# Patient Record
Sex: Female | Born: 1955
Health system: Southern US, Community
[De-identification: ages and names within clinical notes are randomized; demographics above are authoritative.]

---

## 2017-04-21 ENCOUNTER — Inpatient Hospital Stay
Payer: BC Managed Care – PPO | Attending: Internal Medicine | Admitting: Rehabilitative and Restorative Service Providers"

## 2017-04-21 DIAGNOSIS — R42 Dizziness and giddiness: Secondary | ICD-10-CM | POA: Insufficient documentation

## 2017-04-21 NOTE — Progress Notes (Signed)
Name: Brianna Smith Age: 61 y.o.   Referring Physician: Lestine Mount, MD   Date of Injury: 03/23/2017  Date Care Plan Established/Reviewed: 04/21/2017  Date Treatment Started: 04/21/2017  Visit Count: 1   Diagnosis:   1. Dizziness        Subjective     History of Present Illness   History of Present Illness: Pt reports that she has been having bouts of vertigo.  Saw PCP 3-4 months ago and he performed Eply maneuver and I vomited in the office. I am here to assess what I can do to prevent in the future. For about two weeks I have been feeling pretty good. Performing all of my normal everyday activities.   Pt with h/o osetopenia.   No vision changes, no hearing changes, no HA.   No extremity N/T.   Functional Limitations (PLOF): Currently no functional limitations.     Objective     Range of Motion   Cervical AROM WFL.    Tests   Cervical     Left (Cervical)   Negative alar ligament, Sharper-Purser and anterior shear.     Right (Cervical)   Negative alar ligament, Sharp-Purser and anterior shear. Vestibular     Negative: spontaneous nystagmus (fixation present), spontaneous nystagmus (fixation supressed), gaze-evoked nystagmus (fixation present) and vertebral artery    Smooth Pursuit: WNL    VOR: WNL H    Saccade: WNL H/V    Left Side   Negative: head thrust, Dix-Hallpike and roll test    Right Side:   Negative: head thrust, Dix-Hallpike and roll test    (-) SCAAT    Modified CTSIB all 30 seconds.   Stable surface eyes open  Stable surface eyes closed  Unstable surface eyes open  Unstable surface eyes closed.              Treatment     Therapeutic Exercises   Justification: To improve Balance, Flexibility/ROM, Stabilization and Strength     lengthy discussion with pt about eval findings, etiology of BPPV, treatment, concerns regarding self performance of treatment should BPPV reoccur.   (25' total)     Neuromuscular Re-Education   Justification: To improve facilitation, strength and endurance of involved  segments  NA    Manual Therapy   Justification: To improve mobility and reduce soft tissue and jt mechanical restrictions impeding movement and inhibiting active motion.    NA    Therapeutic Activity   Justification: To improve pt ability to perform functional activities related to daily activities and goals established throughout course of episode of care.  NA       ---      ---   Total Time   Timed Minutes  25 minutes   Untimed Minutes  25 minutes   Total Time  50 minutes        Assessment   Brianna Smith is a 61 y.o. female presenting with c/o prior vertigo who requires Physical Therapy for the following:  Impairments: hesitation with movement, transfers.     Pain located: NA    Clinical presentation: stable symptoms have resolved.   Barriers to therapy: none  Prior Level of Function: Currently no functional limitations.   Prognosis: excellent  Plan   Visits per week: 1  Number of Sessions: 2  [employer] One on One  16109: Therapeutic Exercise: To Develop Strength and Endurance, ROM and Flexibility  [id number]: Gait Training  60454: Neuromuscular Reeducation  97140: Manual Therapy techniques (mobilization, manipulation, manual traction)  60454: Therapeutic Activities: Dynamic activities to improve functional performance  Supervised Modalities  97010: Thermal modalities: hot/cold packs  Pt without s/s balance deficiency, (-) BPPV testing, and no current c/o or functional limitations. Pt does express anxiety about reoccurrence of symptoms. Not unlikely to need one additional session for habituation should she note dizziness in the next 1-2 weeks with resuming normal speed of ADLs, IADLs.        Goals    Goal 1:  Progress HEP to include habituation exercises should pt note reoccurance of vertigo.    Sessions:  2                                         Brianna Smith, PT   [facility] Brianna Smith, PT, DPT   [place] [phone]

## 2018-08-01 ENCOUNTER — Other Ambulatory Visit: Payer: Self-pay | Admitting: Internal Medicine

## 2020-11-18 IMAGING — US US ABDOMEN LIMITED RUQ/ASCITES
1 series · 14 of 25 positions shown · non-contrast
Comparison: None.

CLINICAL DATA: Elevated liver function tests.

EXAM:
ULTRASOUND ABDOMEN LIMITED RIGHT UPPER QUADRANT

[Series 1: us abdomen limited ruq/ascites · 0.22mm/px · 14 of 72 slices shown]
[im 1/72]
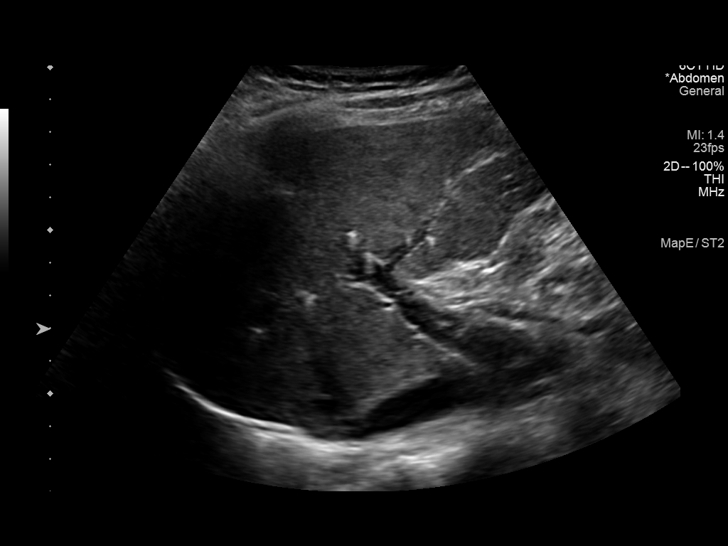
[im 6/72]
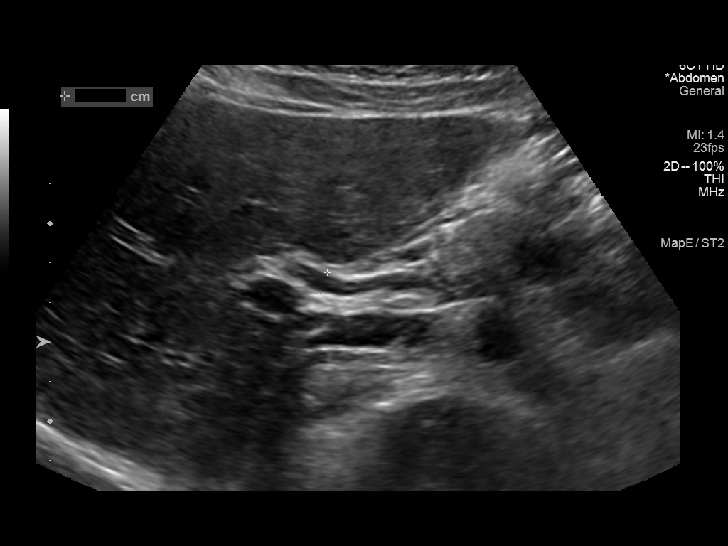
[im 12/72]
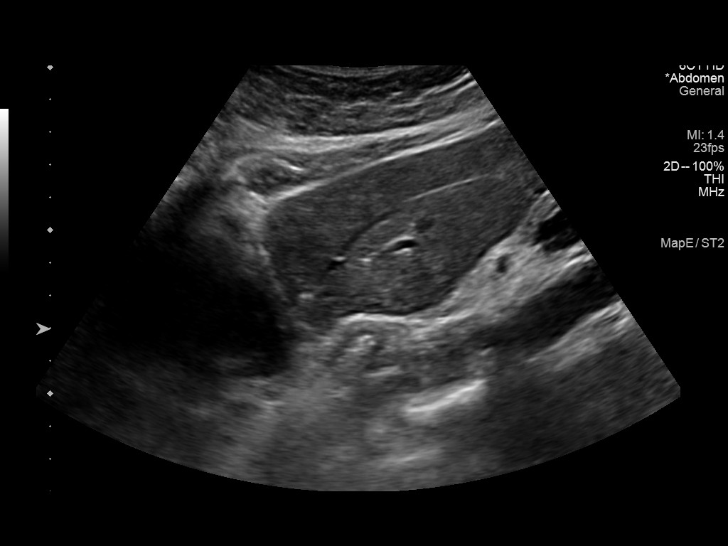
[im 18/72]
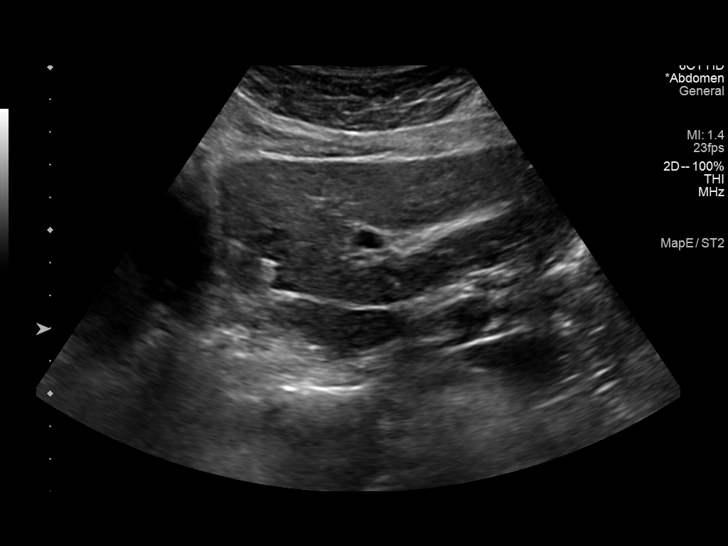
[im 24/72]
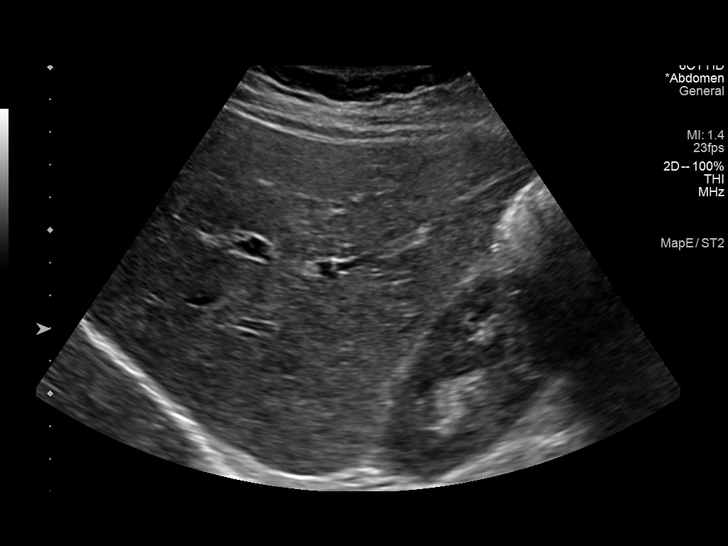
[im 27/72]
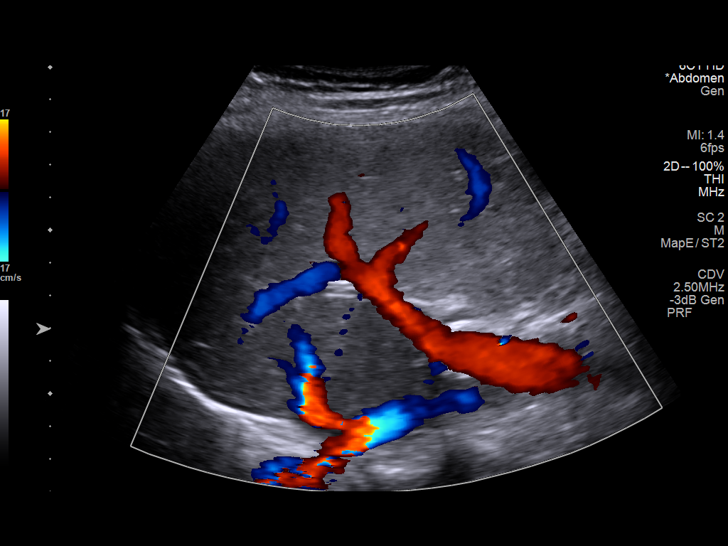
[im 33/72]
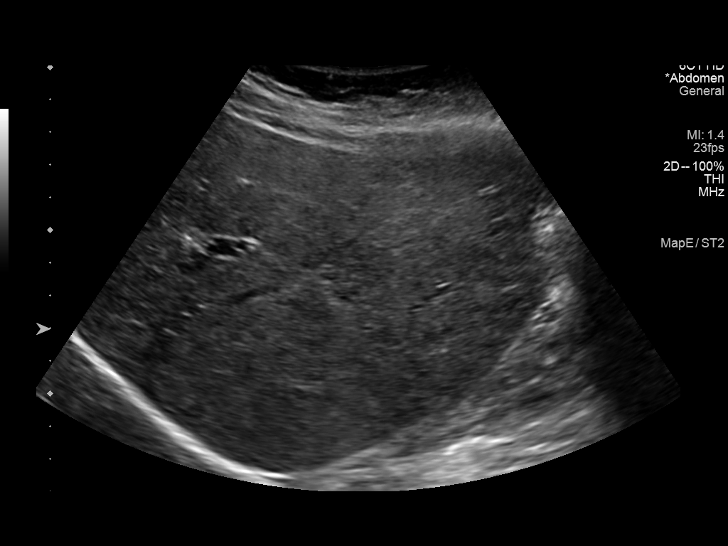
[im 39/72]
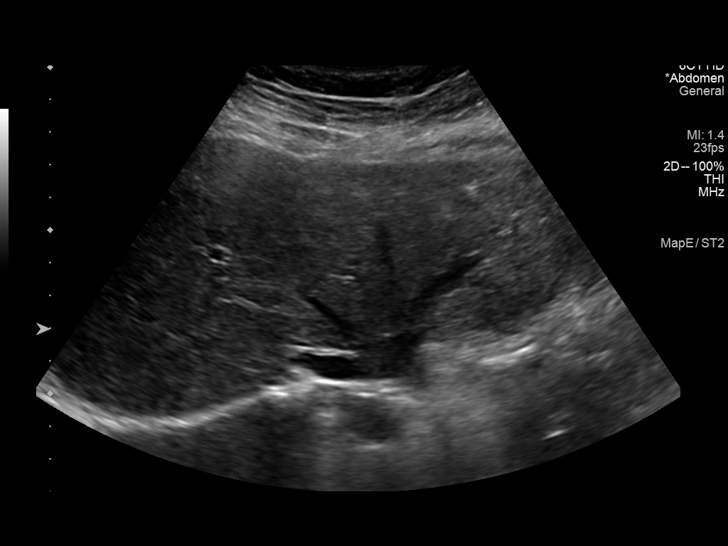
[im 45/72]
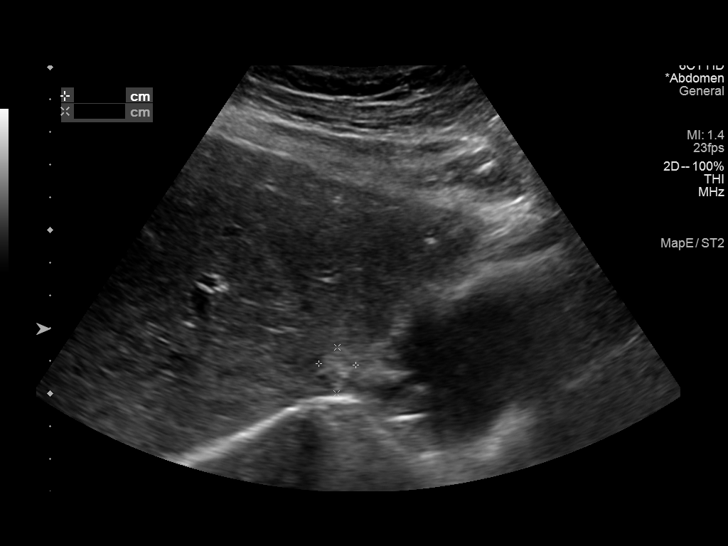
[im 48/72]
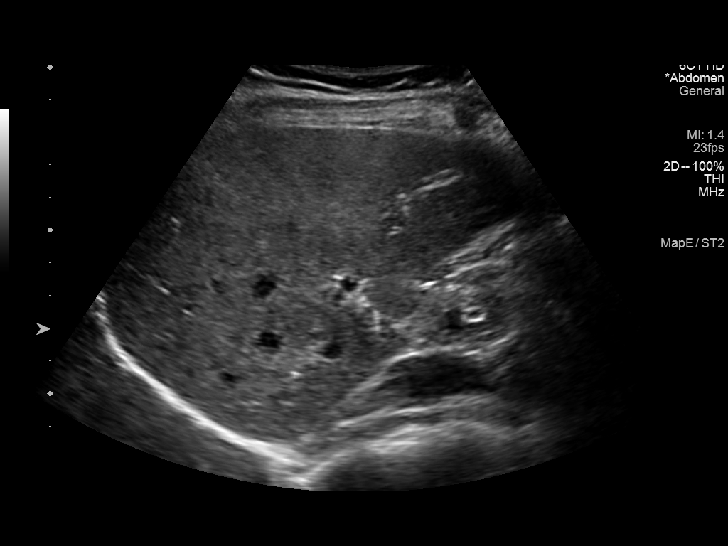
[im 54/72]
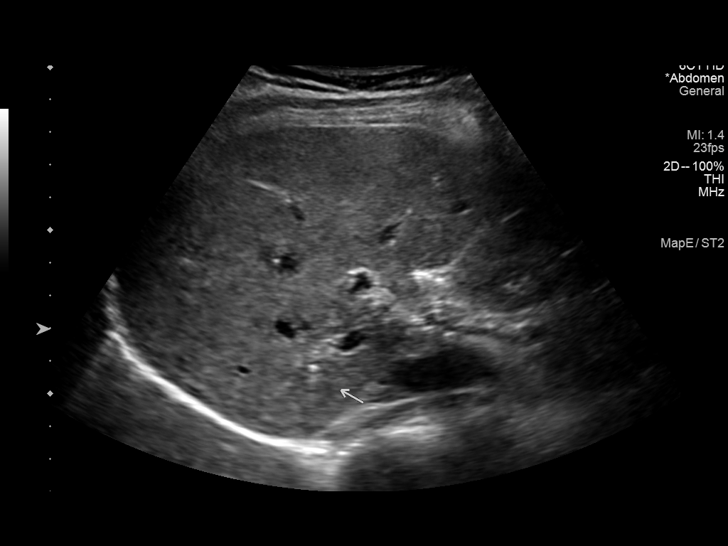
[im 60/72]
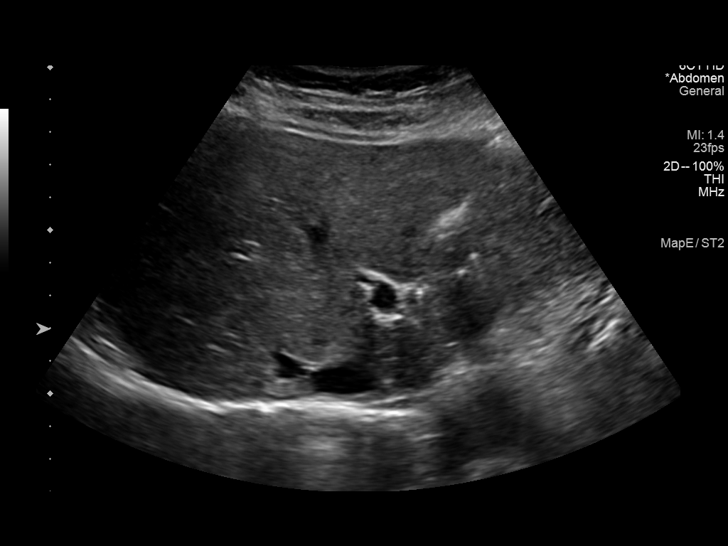
[im 66/72]
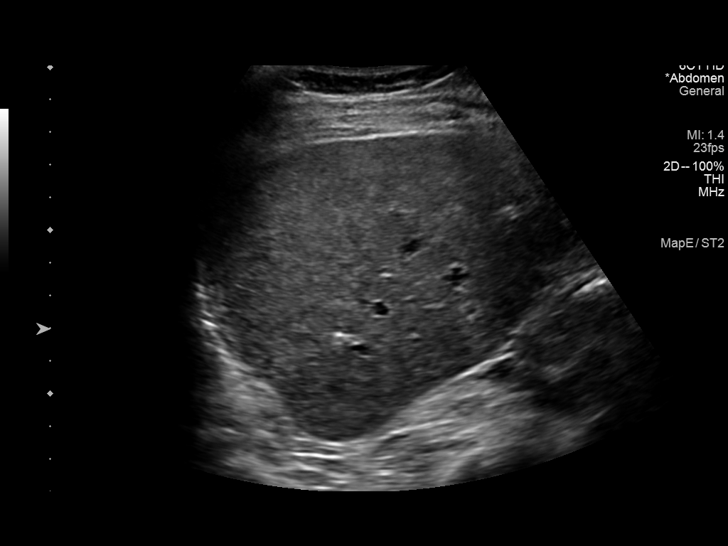
[im 72/72]
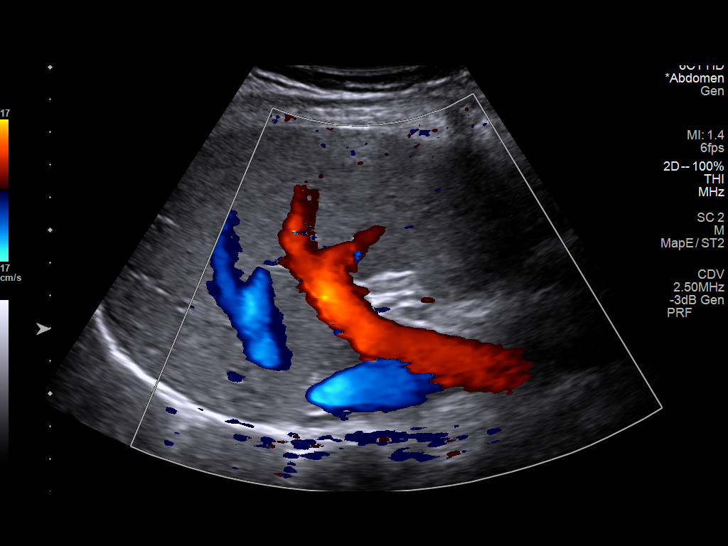

[14 of 25 positions shown; findings below may reference images not displayed]

FINDINGS: Gallbladder:

Status post cholecystectomy.

Common bile duct:

Diameter: 6 mm which is within normal limits.

Liver:

Several hyperechoic areas are noted which may represent hemangiomas,
but MRI is recommended to rule out other pathology. Within normal
limits in parenchymal echogenicity. Portal vein is patent on color
Doppler imaging with normal direction of blood flow towards the
liver.

Other: None.
IMPRESSION: Status post cholecystectomy.

Several hyperechoic areas are noted in the a patent parenchyma which
may represent hemangiomas, but other pathology cannot be excluded.
Further evaluation with MRI is recommended.

## 2020-12-07 IMAGING — MR MR ABDOMEN WO/W CM
12 of 17 series · 28 of 48 positions shown · IV contrast (16ml Multihance)
Comparison: None

CLINICAL DATA: Follow-up hepatic hemangioma suspected based on
ultrasound in a 64-year-old female

EXAM:
MRI ABDOMEN WITHOUT AND WITH CONTRAST
TECHNIQUE: Multiplanar multisequence MR imaging of the abdomen was performed
both before and after the administration of intravenous contrast.
CONTRAST:  16mL MULTIHANCE GADOBENATE DIMEGLUMINE 529 MG/ML IV SOLN

[Series 3: T2 · coronal · 5.0mm · 1.56mm/px · 1 of 27 slices shown (1 of 3)]
[im 1/27]
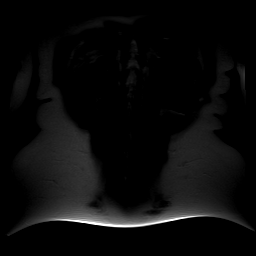

[Series 4: axial tru fisp · axial · 5.0mm · 1.41mm/px · 1 of 39 slices shown]
[im 1/39]
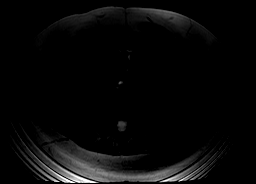

[Series 5: T2 · axial · 6.5mm · 0.68mm/px · 1 of 30 slices shown (2 of 3)]
[im 1/30]
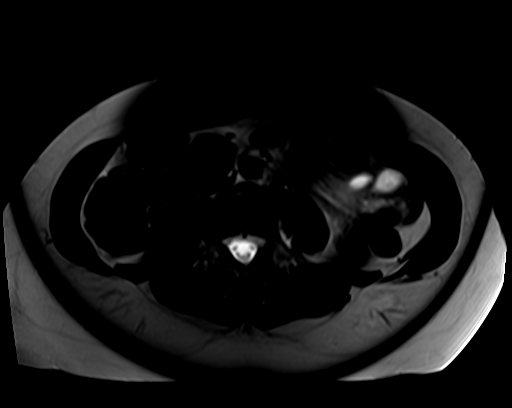

[Series 6: ep2d_diff_b50_500_800_p2 · axial · 6.0mm · 1.82mm/px · z∈[-82,+144]mm · 3 of 89 slices shown]
[im 1/89]
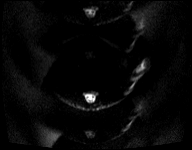
[im 45/89]
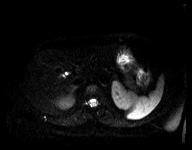
[im 89/89]
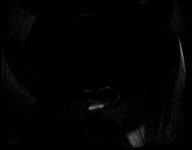

[Series 7: ep2d_diff_b50_500_800_p2_adc · axial · 6.0mm · 1.82mm/px · 1 of 29 slices shown]
[im 1/29]
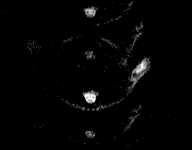

[Series 8: T2 · axial · 5.0mm · 1.37mm/px · 1 of 35 slices shown (3 of 3)]
[im 1/35]
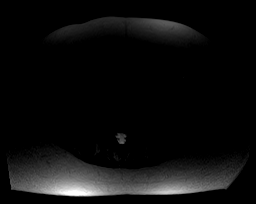

[Series 9: axial in out · axial · 6.0mm · 0.68mm/px · z∈[-118,+96]mm · 2 of 64 slices shown]
[im 1/64]
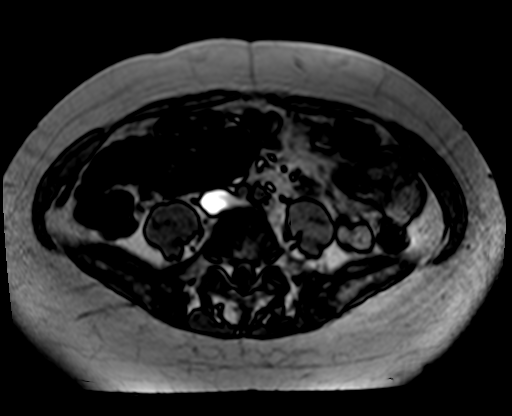
[im 64/64]
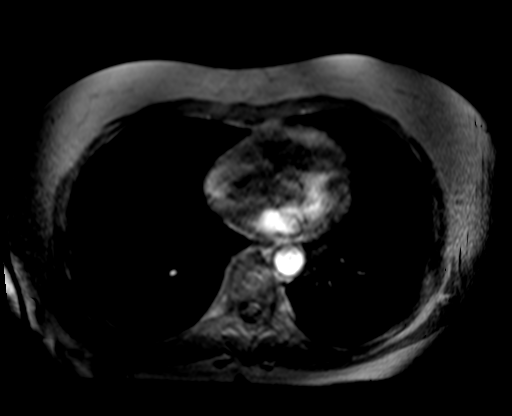

[Series 10: T1 dynamic · axial · non-contrast · 2.2mm · 0.68mm/px · z∈[-115,+94]mm · 4 of 96 slices shown]
[im 1/96]
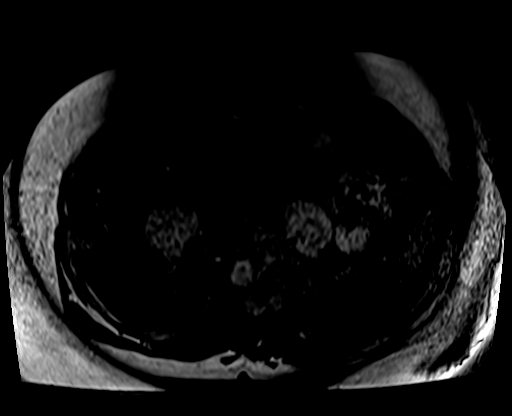
[im 32/96]
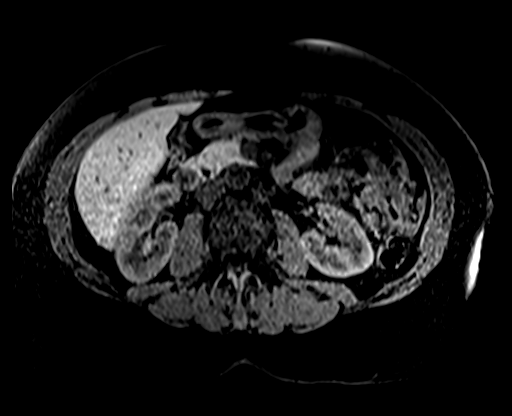
[im 64/96]
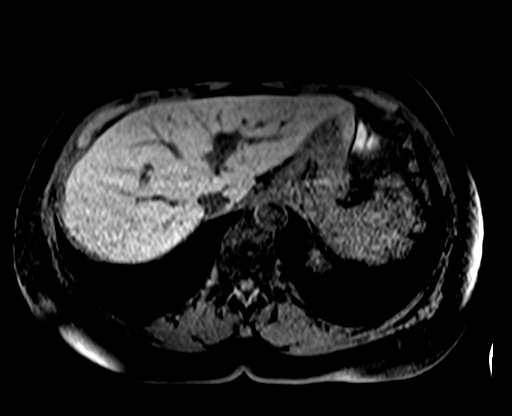
[im 96/96]
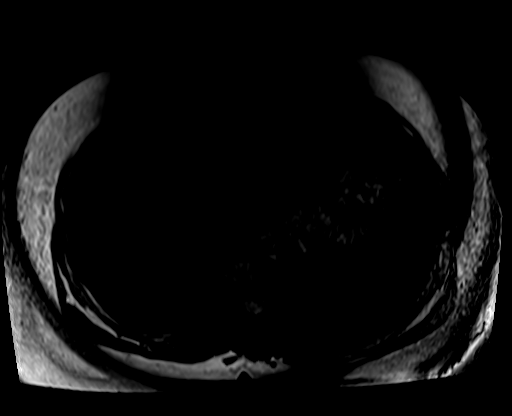

[Series 11: post 25 sec · axial · 2.2mm · 0.68mm/px · z∈[-115,+94]mm · 4 of 96 slices shown]
[im 1/96]
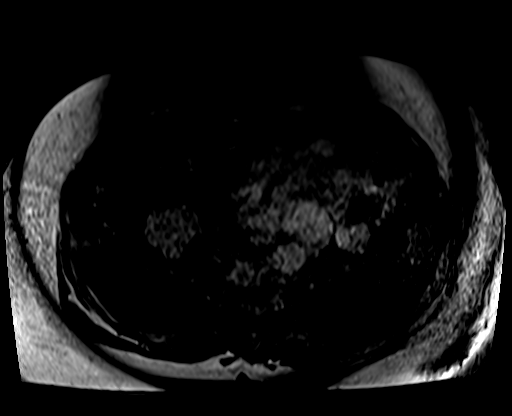
[im 32/96]
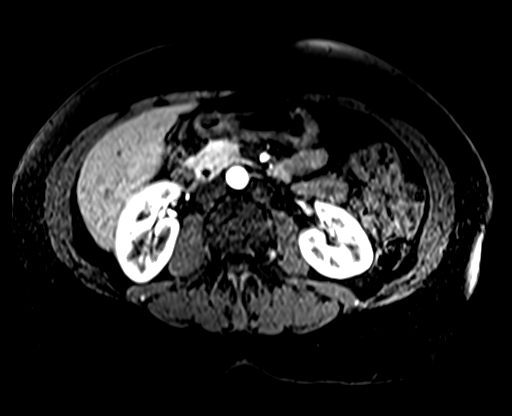
[im 64/96]
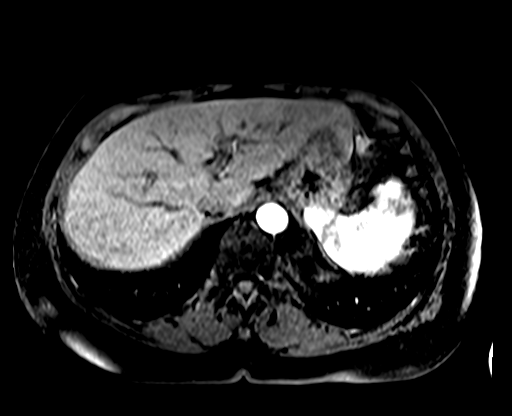
[im 96/96]
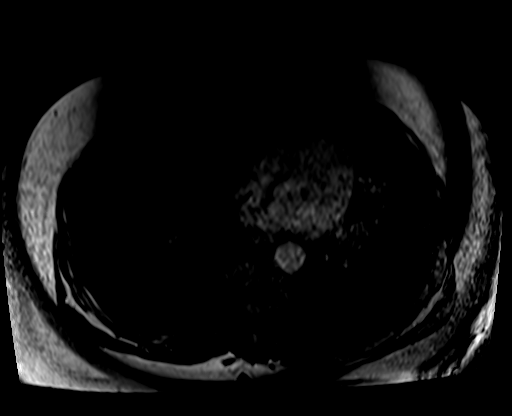

[Series 12: post 25 sec_sub · axial · 2.2mm · 0.68mm/px · z∈[-115,+94]mm · 4 of 96 slices shown]
[im 1/96]
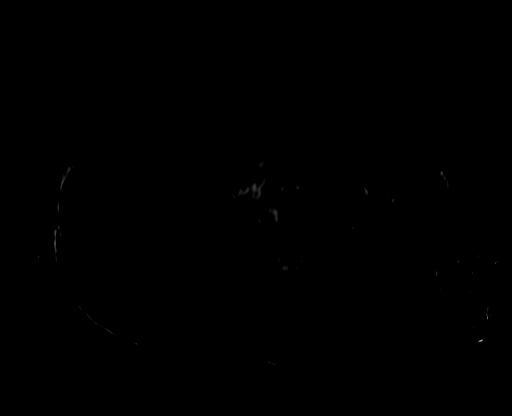
[im 32/96]
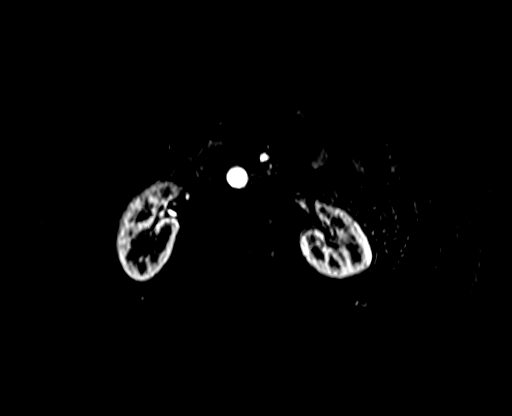
[im 64/96]
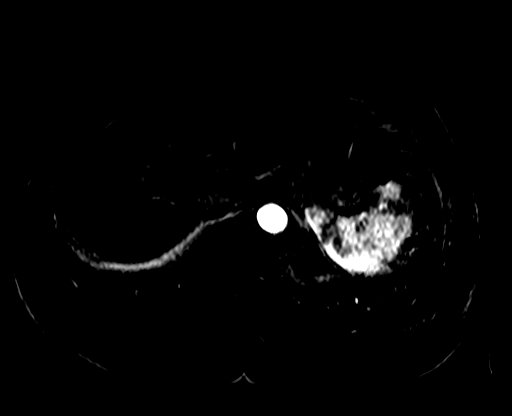
[im 96/96]
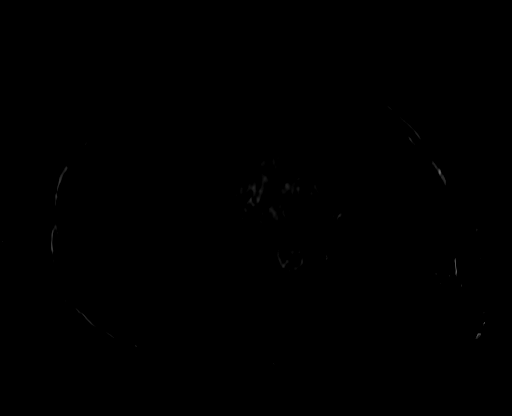

[Series 13: post 45 sec · axial · 2.2mm · 0.68mm/px · z∈[-115,+94]mm · 4 of 96 slices shown]
[im 1/96]
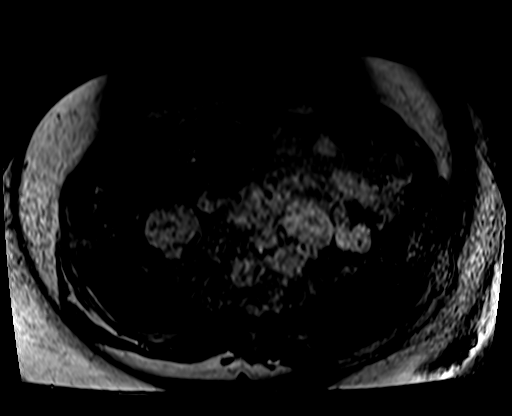
[im 32/96]
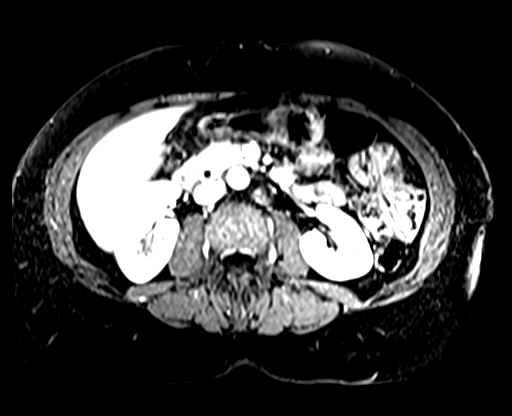
[im 64/96]
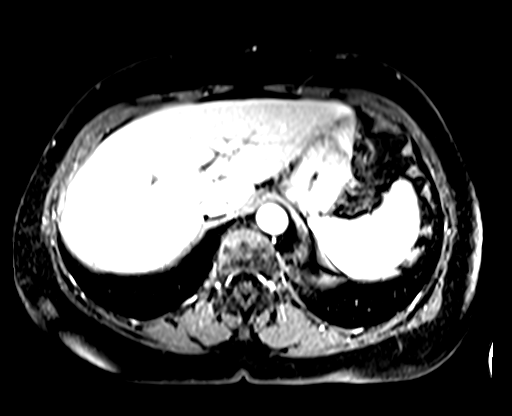
[im 96/96]
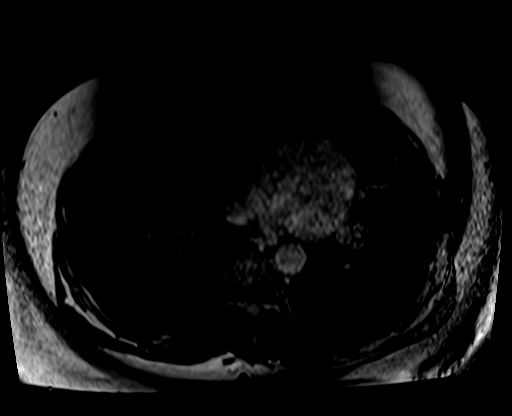

[Series 14: post 45 sec_sub · axial · 2.2mm · 0.68mm/px · z∈[-115,-47]mm · 2 of 96 slices shown]
[im 1/96]
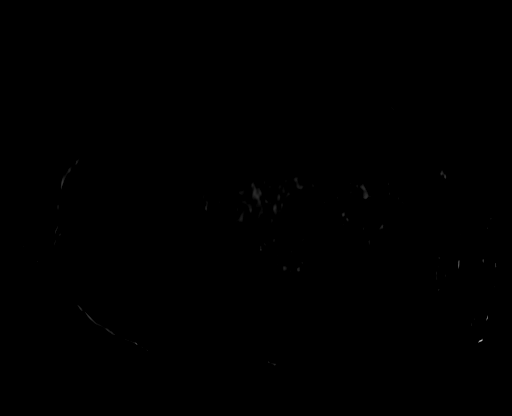
[im 32/96]
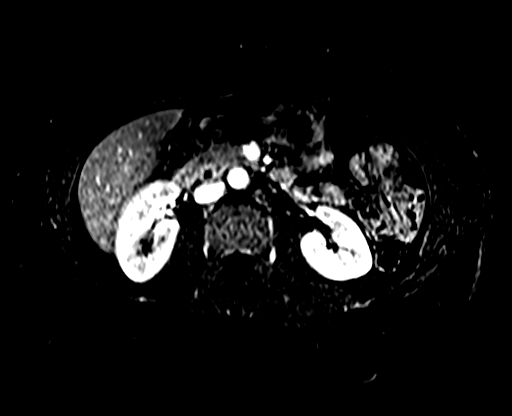

[28 of 48 positions shown; findings below may reference images not displayed]

FINDINGS: Lower chest: Incidental imaging of the lung bases is unremarkable on
MRI, limited assessment.

Hepatobiliary: Post cholecystectomy. No biliary duct dilation. No
significant fat or iron deposition in the liver. No focal,
suspicious hepatic lesion. Portal vein is patent. Hepatic veins are
patent.

Pancreas: Pancreas with normal intrinsic T1 signal. No ductal
dilation. No sign of inflammation.

Spleen: Spleen normal size and contour. The no focal, suspicious
splenic lesion.

Adrenals/Urinary Tract: Adrenal glands are normal. Symmetric renal
enhancement. No hydronephrosis or suspicious lesion.

Stomach/Bowel: Visualized portions within the abdomen are
unremarkable.

Vascular/Lymphatic: No pathologically enlarged lymph nodes
identified. No abdominal aortic aneurysm demonstrated.

Other: Small area of T2 hyperintensity along the LEFT
paraspinal/periaortic chain measuring approximately 1.4 x 1.3 cm on
image 27 of series 4. Area displays signal loss on out of phase as
compared to in phase T1 weighted gradient echo imaging and lacks
significant enhancement.

Musculoskeletal: No suspicious bone lesions identified.
IMPRESSION: 1. No suspicious hepatic lesion. Areas in the liver may have
represented artifact along the margin of the liver could be related
to areas of portal fat at the caudate and RIGHT and LEFT lobe
interface.
2. Post cholecystectomy. No biliary duct dilation.
3. Small area of T2 hyperintensity along the LEFT
paraspinal/periaortic chain measuring approximately 1.4 x 1.3 cm.
Areas of signal loss on out of phase T1 weighted imaging and lacks
significant enhancement. This is favored to represent a small
lymphangioma based on the presence of signal loss which raise the
question of triglyceride content. Lack of significant enhancement is
reassuring as well. Consider six-month follow-up to assess for
stability.

## 2022-01-29 IMAGING — MR MR ABDOMEN WO/W CM
10 of 19 series · 21 of 48 positions shown · IV contrast (15ml multihance)
Comparison: MRI abdomen mixed [DATE]
COMPARISON: MRI abdomen mixed [DATE]
COMPARISON: MRI abdomen mixed [DATE]

Addendum:
CLINICAL DATA: Follow-up abdominal mass seen on prior MRI.

EXAM:
MRI ABDOMEN WITHOUT AND WITH CONTRAST
TECHNIQUE: Multiplanar multisequence MR imaging of the abdomen was performed
both before and after the administration of intravenous contrast.
CONTRAST:  15mL MULTIHANCE GADOBENATE DIMEGLUMINE 529 MG/ML IV SOLN

[Series 3: cor haste · coronal · 5.0mm · 0.72mm/px · 1 of 29 slices shown]
[im 1/29]
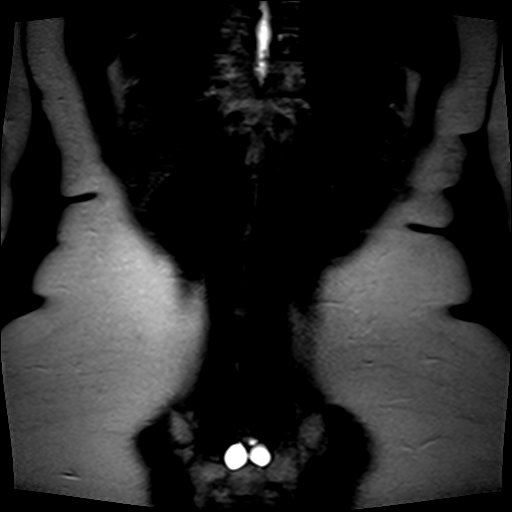

[Series 4: axial haste · axial · 6.0mm · 0.72mm/px · 1 of 37 slices shown]
[im 1/37]
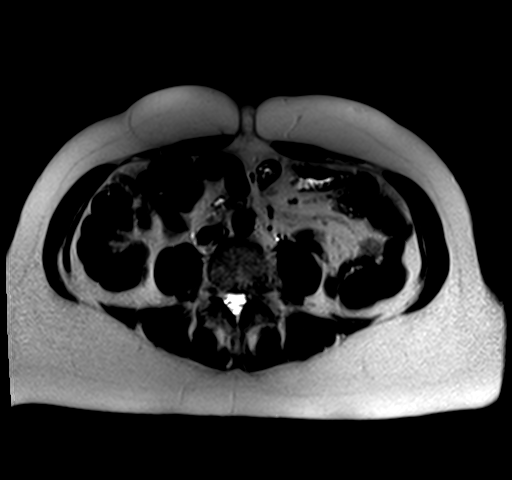

[Series 5: T1 · axial · 6.0mm · 0.72mm/px · z∈[-93,+144]mm · 3 of 74 slices shown]
[im 1/74]
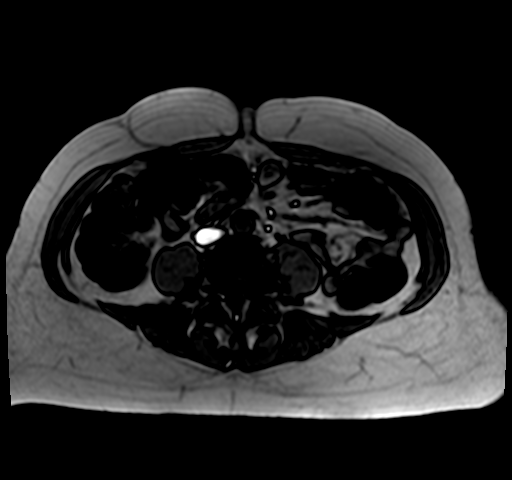
[im 37/74]
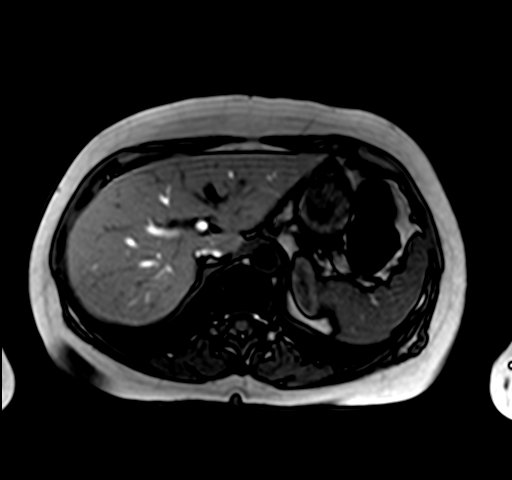
[im 74/74]
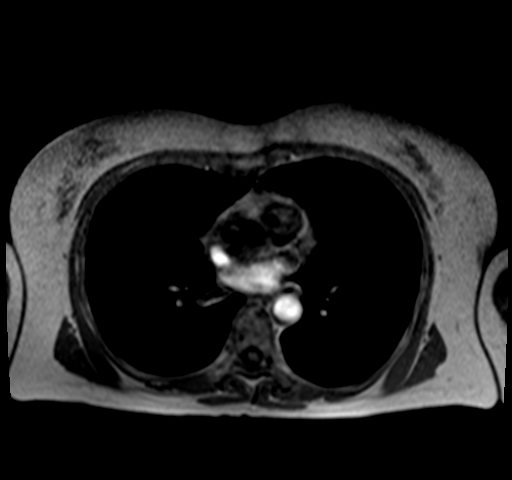

[Series 6: bSSFP · axial · 4.0mm · 0.72mm/px · z∈[-94,+146]mm · 2 of 61 slices shown]
[im 1/61]
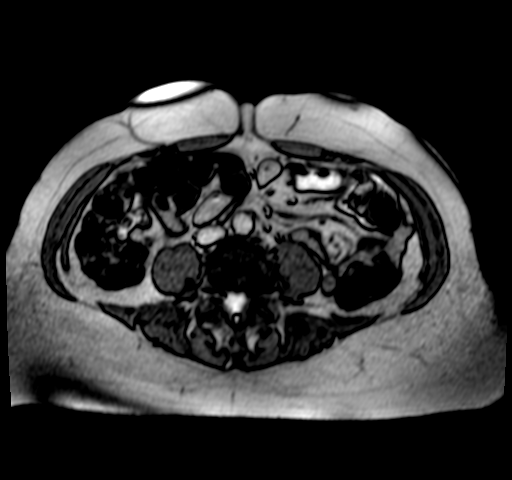
[im 61/61]
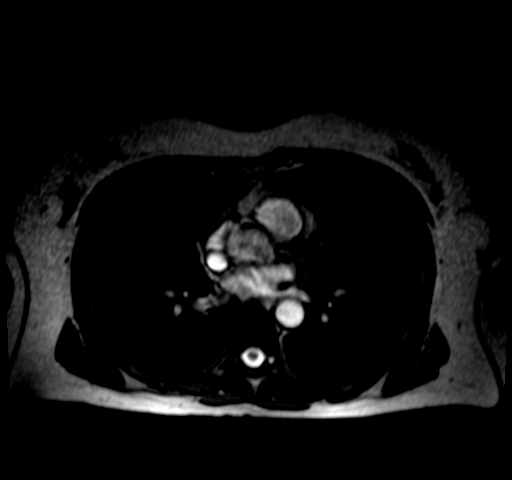

[Series 7: T2 fat-sat · axial · 6.0mm · 1.16mm/px · 1 of 37 slices shown]
[im 1/37]
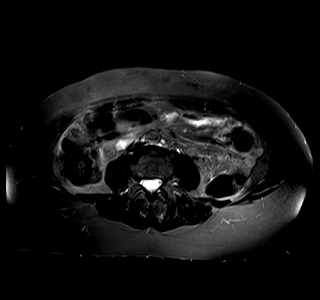

[Series 8: ep2d_diff_b50_500_800_p2_trig · axial · 6.0mm · 1.93mm/px · z∈[-73,+186]mm · 4 of 111 slices shown]
[im 1/111]
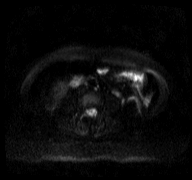
[im 37/111]
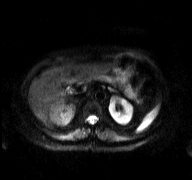
[im 74/111]
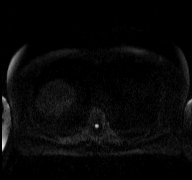
[im 111/111]
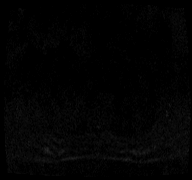

[Series 9: ep2d_diff_b50_500_800_p2_trig_adc · axial · 6.0mm · 1.93mm/px · 1 of 37 slices shown]
[im 1/37]
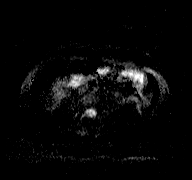

[Series 10: T1 dynamic · axial · non-contrast · 3.0mm · 0.72mm/px · z∈[-98,+139]mm · 3 of 80 slices shown]
[im 1/80]
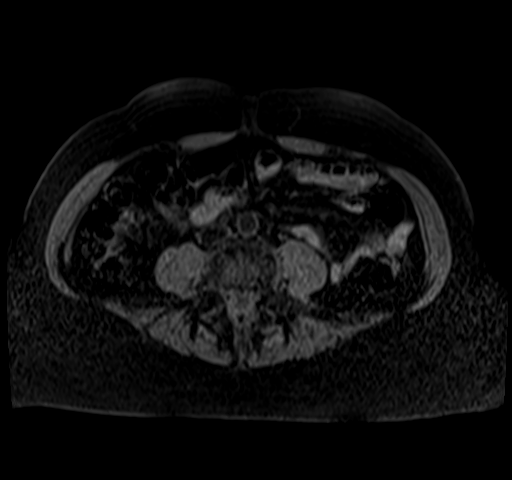
[im 40/80]
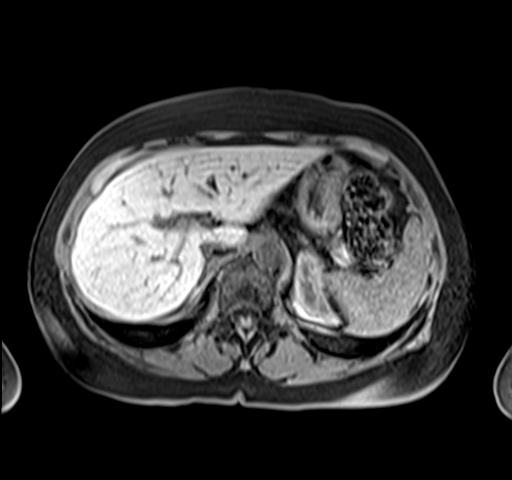
[im 80/80]
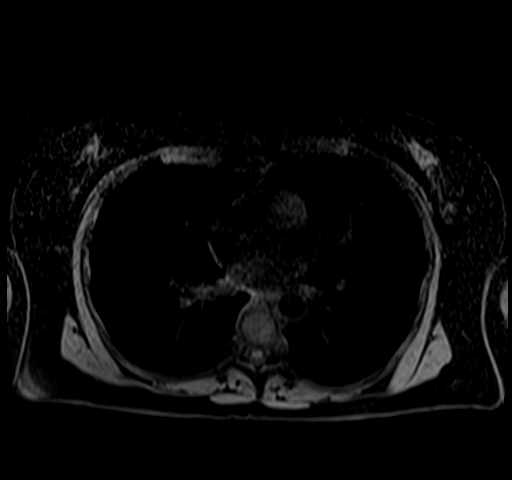

[Series 11: T1 dynamic post-contrast · axial · 3.0mm · 0.72mm/px · z∈[-98,+139]mm · 3 of 80 slices shown (1 of 2)]
[im 1/80]
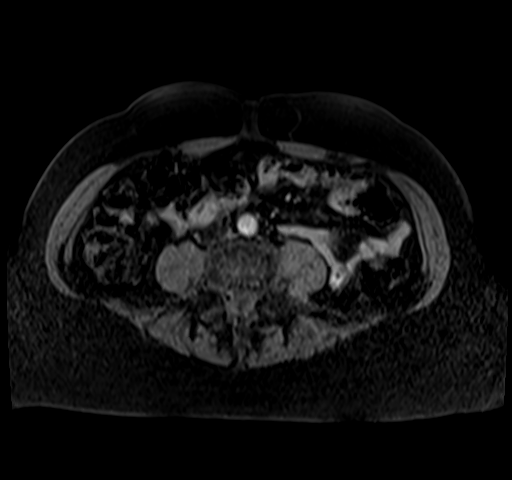
[im 40/80]
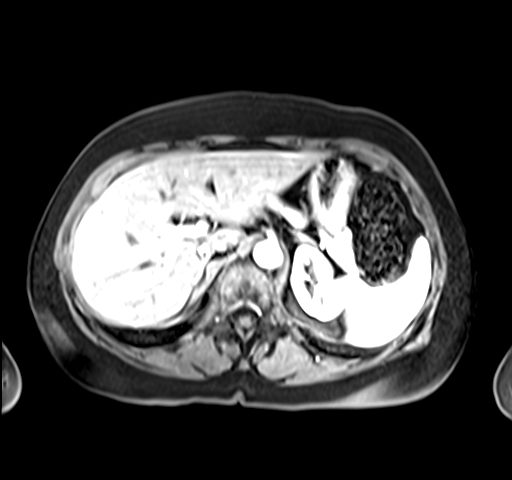
[im 80/80]
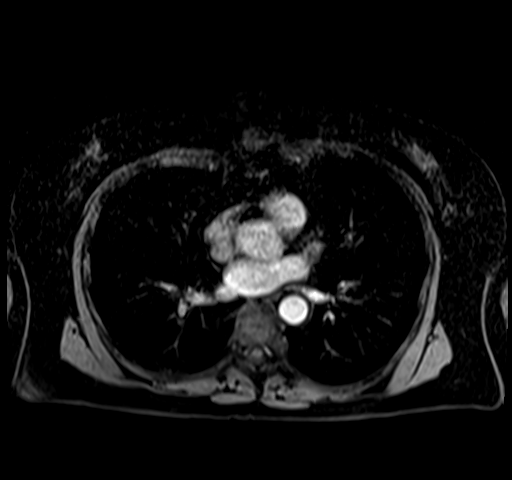

[Series 12: T1 dynamic post-contrast · axial · 3.0mm · 0.72mm/px · z∈[-98,+19]mm · 2 of 80 slices shown (2 of 2)]
[im 1/80]
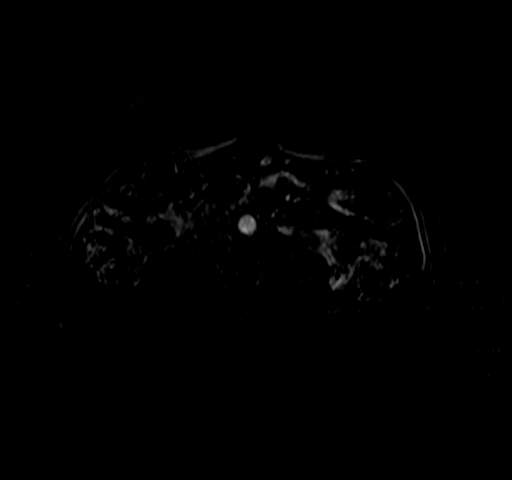
[im 40/80]
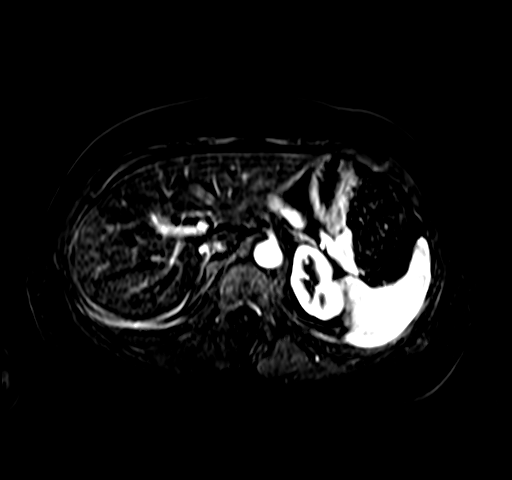

[21 of 48 positions shown; findings below may reference images not displayed]

FINDINGS: Lower chest: No acute abnormality.

Hepatobiliary: No suspicious hepatic lesion. Gallbladder surgically
absent. No biliary ductal dilation.

Pancreas: Pancreatic ductal dilation or evidence of acute
inflammation. No cystic or solid hyperenhancing pancreatic lesion.

Spleen:  Within normal limits in size and appearance.

Adrenals/Urinary Tract: No masses identified. No evidence of
hydronephrosis.

Stomach/Bowel: Visualized portions within the abdomen are
unremarkable.

Vascular/Lymphatic: Pathologically enlarged lymph nodes. No
abdominal aortic aneurysm.

Other: Lobular well-circumscribed T2 hyperintense lesion along the
left paraspinal/periaortic chain measures 17 x 15 mm on image [DATE]
previously 16 x 15 mm when remeasured for consistency the lesion
does not demonstrate reduced diffusivity or suspicious postcontrast
enhancement on subtraction sequences and is most consistent with a
benign finding likely reflecting a lymphangioma.

Musculoskeletal: Benign sacral Tarlov cysts. No suspicious osseous
lesion.
IMPRESSION: Stable T2 hyperintense lobular retroperitoneal left paraspinal
lesion which does not demonstrate reduced diffusivity or suspicious
postcontrast enhancement, reflecting a benign finding with imaging
characteristics most consistent with a benign lymphangioma.

ADDENDUM:
Dictation error in the pancreatic section of the findings portion of
the report. This section should state:

NO pancreatic ductal dilation or evidence of acute inflammation. NO
cystic or solid hyperenhancing pancreatic lesion.

ADDENDUM:
Dictation error in the vascular/lymphatic section of the findings
portion of the report. This section should state:

NO pathologically enlarged lymph nodes. No abdominal aortic
aneurysm.

*** End of Addendum ***
Addendum:
FINDINGS: Lower chest: No acute abnormality.

Hepatobiliary: No suspicious hepatic lesion. Gallbladder surgically
absent. No biliary ductal dilation.

Pancreas: Pancreatic ductal dilation or evidence of acute
inflammation. No cystic or solid hyperenhancing pancreatic lesion.

Spleen:  Within normal limits in size and appearance.

Adrenals/Urinary Tract: No masses identified. No evidence of
hydronephrosis.

Stomach/Bowel: Visualized portions within the abdomen are
unremarkable.

Vascular/Lymphatic: Pathologically enlarged lymph nodes. No
abdominal aortic aneurysm.

Other: Lobular well-circumscribed T2 hyperintense lesion along the
left paraspinal/periaortic chain measures 17 x 15 mm on image [DATE]
previously 16 x 15 mm when remeasured for consistency the lesion
does not demonstrate reduced diffusivity or suspicious postcontrast
enhancement on subtraction sequences and is most consistent with a
benign finding likely reflecting a lymphangioma.

Musculoskeletal: Benign sacral Tarlov cysts. No suspicious osseous
lesion.
IMPRESSION: Stable T2 hyperintense lobular retroperitoneal left paraspinal
lesion which does not demonstrate reduced diffusivity or suspicious
postcontrast enhancement, reflecting a benign finding with imaging
characteristics most consistent with a benign lymphangioma.

ADDENDUM:
Dictation error in the pancreatic section of the findings portion of
the report. This section should state:

NO pancreatic ductal dilation or evidence of acute inflammation. NO
cystic or solid hyperenhancing pancreatic lesion.

*** End of Addendum ***
FINDINGS: Lower chest: No acute abnormality.

Hepatobiliary: No suspicious hepatic lesion. Gallbladder surgically
absent. No biliary ductal dilation.

Pancreas: Pancreatic ductal dilation or evidence of acute
inflammation. No cystic or solid hyperenhancing pancreatic lesion.

Spleen:  Within normal limits in size and appearance.

Adrenals/Urinary Tract: No masses identified. No evidence of
hydronephrosis.

Stomach/Bowel: Visualized portions within the abdomen are
unremarkable.

Vascular/Lymphatic: Pathologically enlarged lymph nodes. No
abdominal aortic aneurysm.

Other: Lobular well-circumscribed T2 hyperintense lesion along the
left paraspinal/periaortic chain measures 17 x 15 mm on image [DATE]
previously 16 x 15 mm when remeasured for consistency the lesion
does not demonstrate reduced diffusivity or suspicious postcontrast
enhancement on subtraction sequences and is most consistent with a
benign finding likely reflecting a lymphangioma.

Musculoskeletal: Benign sacral Tarlov cysts. No suspicious osseous
lesion.
IMPRESSION: Stable T2 hyperintense lobular retroperitoneal left paraspinal
lesion which does not demonstrate reduced diffusivity or suspicious
postcontrast enhancement, reflecting a benign finding with imaging
characteristics most consistent with a benign lymphangioma.
